# Patient Record
Sex: Male | Born: 1980 | Race: White | Hispanic: No | Marital: Single | Smoking: Former smoker
Health system: Southern US, Community
[De-identification: ages and names within clinical notes are randomized; demographics above are authoritative.]

---

## 2005-02-13 ENCOUNTER — Emergency Department: Payer: Self-pay | Admitting: Emergency Medicine

## 2005-02-14 ENCOUNTER — Other Ambulatory Visit: Payer: Self-pay

## 2005-06-09 ENCOUNTER — Emergency Department: Payer: Self-pay | Admitting: Emergency Medicine

## 2007-09-25 ENCOUNTER — Emergency Department: Payer: Self-pay | Admitting: Unknown Physician Specialty

## 2008-01-23 ENCOUNTER — Emergency Department: Payer: Self-pay | Admitting: Emergency Medicine

## 2008-06-05 ENCOUNTER — Emergency Department: Payer: Self-pay | Admitting: Emergency Medicine

## 2008-06-05 ENCOUNTER — Other Ambulatory Visit: Payer: Self-pay

## 2008-08-14 ENCOUNTER — Emergency Department: Payer: Self-pay | Admitting: Emergency Medicine

## 2009-02-13 ENCOUNTER — Emergency Department: Payer: Self-pay | Admitting: Emergency Medicine

## 2010-10-15 ENCOUNTER — Emergency Department: Payer: Self-pay | Admitting: Emergency Medicine

## 2011-09-30 ENCOUNTER — Emergency Department: Payer: Self-pay | Admitting: Emergency Medicine

## 2011-09-30 LAB — URINALYSIS, COMPLETE
Bacteria: NONE SEEN
Bilirubin,UR: NEGATIVE
Blood: NEGATIVE
Glucose,UR: NEGATIVE mg/dL (ref 0–75)
Leukocyte Esterase: NEGATIVE
Nitrite: NEGATIVE
Protein: NEGATIVE
Specific Gravity: 1.015 (ref 1.003–1.030)
Squamous Epithelial: <1

## 2011-11-08 ENCOUNTER — Emergency Department: Payer: Self-pay | Admitting: Emergency Medicine

## 2012-04-29 ENCOUNTER — Emergency Department: Payer: Self-pay | Admitting: Emergency Medicine

## 2012-04-29 LAB — URINALYSIS, COMPLETE
Bilirubin,UR: NEGATIVE
Glucose,UR: NEGATIVE mg/dL (ref 0–75)
Leukocyte Esterase: NEGATIVE
Ph: 6 (ref 4.5–8.0)
Protein: NEGATIVE
RBC,UR: <1 /HPF (ref 0–5)
Squamous Epithelial: NONE SEEN

## 2015-07-13 ENCOUNTER — Encounter: Payer: Self-pay | Admitting: Emergency Medicine

## 2015-07-13 ENCOUNTER — Emergency Department: Payer: Worker's Compensation

## 2015-07-13 ENCOUNTER — Emergency Department
Admission: EM | Admit: 2015-07-13 | Discharge: 2015-07-13 | Disposition: A | Discharge status: Home / Self Care | Payer: Worker's Compensation | Attending: Emergency Medicine | Admitting: Emergency Medicine

## 2015-07-13 DIAGNOSIS — Y9289 Other specified places as the place of occurrence of the external cause: Secondary | ICD-10-CM | POA: Diagnosis not present

## 2015-07-13 DIAGNOSIS — W311XXA Contact with metalworking machines, initial encounter: Secondary | ICD-10-CM | POA: Insufficient documentation

## 2015-07-13 DIAGNOSIS — Z4800 Encounter for change or removal of nonsurgical wound dressing: Secondary | ICD-10-CM | POA: Insufficient documentation

## 2015-07-13 DIAGNOSIS — Y990 Civilian activity done for income or pay: Secondary | ICD-10-CM | POA: Diagnosis not present

## 2015-07-13 DIAGNOSIS — S6992XA Unspecified injury of left wrist, hand and finger(s), initial encounter: Secondary | ICD-10-CM | POA: Diagnosis present

## 2015-07-13 DIAGNOSIS — S61432A Puncture wound without foreign body of left hand, initial encounter: Secondary | ICD-10-CM

## 2015-07-13 DIAGNOSIS — Y9389 Activity, other specified: Secondary | ICD-10-CM | POA: Diagnosis not present

## 2015-07-13 DIAGNOSIS — Z23 Encounter for immunization: Secondary | ICD-10-CM | POA: Diagnosis not present

## 2015-07-13 DIAGNOSIS — S61442A Puncture wound with foreign body of left hand, initial encounter: Secondary | ICD-10-CM | POA: Diagnosis not present

## 2015-07-13 MED ORDER — AMOXICILLIN-POT CLAVULANATE 875-125 MG PO TABS: 1.0000 | ORAL_TABLET | Freq: Two times a day (BID) | ORAL | Status: AC

## 2015-07-13 MED ORDER — LIDOCAINE HCL (PF) 1 % IJ SOLN
INTRAMUSCULAR | Status: AC
  Administered 2015-07-13: 5 mL
  Filled 2015-07-13: qty 5

## 2015-07-13 MED ORDER — TETANUS-DIPHTH-ACELL PERTUSSIS 5-2.5-18.5 LF-MCG/0.5 IM SUSP
0.5000 mL | Freq: Once | INTRAMUSCULAR | Status: AC
  Administered 2015-07-13: 0.5 mL via INTRAMUSCULAR
  Filled 2015-07-13: qty 0.5

## 2015-07-13 MED ORDER — OXYCODONE-ACETAMINOPHEN 5-325 MG PO TABS: 1.0000 | ORAL_TABLET | Freq: Four times a day (QID) | ORAL | Status: AC | PRN

## 2015-07-13 MED ORDER — OXYCODONE-ACETAMINOPHEN 5-325 MG PO TABS
1.0000 | ORAL_TABLET | Freq: Once | ORAL | Status: AC
  Administered 2015-07-13: 1 via ORAL
  Filled 2015-07-13: qty 1

## 2015-07-13 NOTE — ED Notes (Signed)
Pt with laceration today with a drill bit. Pt was at work and drill slipped. Cms intact.  workmans comp.

## 2015-07-13 NOTE — ED Notes (Signed)
Pt. Seen here today for puncture wound to lt. Hand.  Pt. States bleeding started up again this evening.   Pt. States EMS was called to house, EMS bandaged up lt hand, bleeding controlled at this time.

## 2015-07-13 NOTE — ED Provider Notes (Signed)
[facility] Emergency Department Provider Note  ____________________________________________  Time seen: Approximately 2:49 PM  I have reviewed the triage vital signs and the nursing notes.   HISTORY  Chief Complaint Extremity Laceration    HPI Dakota Lee is a 34 y.o. male who presents to the emergency department complaining of injury to his proximal left thumb. He states that he was at work trying to using drillbit when the drill bit broke causing him to puncture the proximal aspect of his left thumb with the drill bit. He states that this occurred so quickly he was unable to release the trigger on the drill prior to it concerning his hand. He states now he is having considerable amount of pain in the thenar eminence as well as proximal thumb. He states that he does have some numbness to the distal aspect of his thumb. He verbalizes being able to see "a little bit of metal stuck in there." Bleeding was able to be controlled prior to arrival. He denies any other injury. He states that the pain is a sharp sensation. Severe. He is not taking anything prior to arrival.   History reviewed. No pertinent past medical history.  There are no active problems to display for this patient.   History reviewed. No pertinent past surgical history.  Current Outpatient Rx  Name  Route  Sig  Dispense  Refill  . amoxicillin-clavulanate (AUGMENTIN) 875-125 MG tablet   Oral   Take 1 tablet by mouth 2 (two) times daily.   14 tablet   0   . oxyCODONE-acetaminophen (ROXICET) 5-325 MG tablet   Oral   Take 1 tablet by mouth every 6 (six) hours as needed for severe pain.   20 tablet   0     Allergies Review of patient's allergies indicates no known allergies.  No family history on file.  Social History Social History  Substance Use Topics  . Smoking status: Never Smoker   . Smokeless tobacco: None  . Alcohol Use: Yes    Review of Systems Constitutional: No  fever/chills Eyes: No visual changes. ENT: No sore throat. Cardiovascular: Denies chest pain. Respiratory: Denies shortness of breath. Gastrointestinal: No abdominal pain.  No nausea, no vomiting.  No diarrhea.  No constipation. Genitourinary: Negative for dysuria. Musculoskeletal: Negative for back pain. Skin: Negative for rash. Endorses a puncture wound to the proximal left thumb. Neurological: Negative for headaches, focal weakness or numbness.  10-point ROS otherwise negative.  ____________________________________________   PHYSICAL EXAM:  VITAL SIGNS: ED Triage Vitals  Enc Vitals Group     BP 07/13/15 1435 121/83 mmHg     Pulse Rate 07/13/15 1435 82     Resp 07/13/15 1435 20     Temp 07/13/15 1435 98.4 F (36.9 C)     Temp Source 07/13/15 1435 Oral     SpO2 07/13/15 1435 96 %     Weight 07/13/15 1435 140 lb (63.504 kg)     Height 07/13/15 1435  (1.727 m)     Head Cir --      Peak Flow --      Pain Score 07/13/15 1435 10     Pain Loc --      Pain Edu? --      Excl. in GC? --     Constitutional: Alert and oriented. Well appearing and in no acute distress. Eyes: Conjunctivae are normal. PERRL. EOMI. Head: Atraumatic. Nose: No congestion/rhinnorhea. Mouth/Throat: Mucous membranes are moist.  Oropharynx non-erythematous. Neck:  No stridor.   Cardiovascular: Normal rate, regular rhythm. Grossly normal heart sounds.  Good peripheral circulation. Respiratory: Normal respiratory effort.  No retractions. Lungs CTAB. Gastrointestinal: Soft and nontender. No distention. No abdominal bruits. No CVA tenderness. Musculoskeletal: No lower extremity tenderness nor edema.  No joint effusions. Neurologic:  Normal speech and language. No gross focal neurologic deficits are appreciated. No gait instability. Skin:  Skin is warm, dry and intact. No rash noted. Puncture wound noted proximal left thumb. Bleeding controlled. Visible foreign body visualized with dark  metal. Psychiatric: Mood and affect are normal. Speech and behavior are normal.  ____________________________________________   LABS (all labs ordered are listed, but only abnormal results are displayed)  Labs Reviewed - No data to display ____________________________________________  EKG   ____________________________________________  RADIOLOGY  Left hand x-ray Impression: No definitive fractures seen. A metallic foreign body is noted. ____________________________________________   PROCEDURES  Procedure(s) performed: Yes, wound debridement. The patient's wound was cleansed using Betadine. The area was anesthetized using local infiltration with 1% lidocaine with no epi. The wound was then thoroughly irrigated using 100 mL of normal saline. Then instrumentation was used to explore wound with metallic foreign body located and extracted. No visible tendon involvement. Wound was left open. She tolerated procedure well. PMS was intact prior to and after procedure. consent from patient was obtained prior to procedure.  Critical Care performed: No  ____________________________________________   INITIAL IMPRESSION / ASSESSMENT AND PLAN / ED COURSE  Pertinent labs & imaging results that were available during my care of the patient were reviewed by me and considered in my medical decision making (see chart for details).  Patient's history, symptoms, physical exam, and radiological findings are consistent with a puncture wound from a drillbit. There was a retained metallic foreign body that was appreciated both on exam as well as her radiological imaging. The patient was anesthetized using local infiltration of lidocaine and wound was thoroughly explored and irrigated. Foreign body was appreciated and extracted. Patient tolerated procedure well. The patient was given a tetanus shot here in the emergency department since his last tetanus was unknown. Placed on Augmentin and pain medication.  The patient is to follow-up with emergency department should for any worsening of symptoms. The patient verbalizes understanding of diagnosis and treatment plan and verbalizes compliance with same. ____________________________________________   FINAL CLINICAL IMPRESSION(S) / ED DIAGNOSES  Final diagnoses:  Puncture wound, hand, left, initial encounter      Racheal PatchesJonathan D Cuthriell, PA-C 07/13/15 1704  Governor Rooksebecca Lord, MD 07/15/15 1108

## 2015-07-13 NOTE — Discharge Instructions (Signed)
Puncture Wound A puncture wound is an injury that is caused by a sharp, thin object that goes through (penetrates) your skin. Usually, a puncture wound does not leave a large opening in your skin, so it may not bleed a lot. However, when you get a puncture wound, dirt or other materials (foreign bodies) can be forced into your wound and break off inside. This increases the chance of infection, such as tetanus. CAUSES Puncture wounds are caused by any sharp, thin object that goes through your skin, such as:  Animal teeth, as with an animal bite.  Sharp, pointed objects, such as nails, splinters of glass, fishhooks, and needles. SYMPTOMS Symptoms of a puncture wound include:  Pain.  Bleeding.  Swelling.  Bruising.  Fluid leaking from the wound.  Numbness, tingling, or loss of function. DIAGNOSIS This condition is diagnosed with a medical history and physical exam. Your wound will be checked to see if it contains any foreign bodies. You may also have X-rays or other imaging tests. TREATMENT Treatment for a puncture wound depends on how serious the wound is. It also depends on whether the wound contains any foreign bodies. Treatment for all types of puncture wounds usually starts with:  Controlling the bleeding.  Washing out the wound with a germ-free (sterile) salt-water solution.  Checking the wound for foreign bodies. Treatment may also include:  Having the wound opened surgically to remove a foreign object.  Closing the wound with stitches (sutures) if it continues to bleed.  Covering the wound with antibiotic ointments and a bandage (dressing).  Receiving a tetanus shot.  Receiving a rabies vaccine. HOME CARE INSTRUCTIONS Medicines  Take or apply over-the-counter and prescription medicines only as told by your health care provider.  If you were prescribed an antibiotic, take or apply it as told by your health care provider. Do not stop using the antibiotic even if  your condition improves. Wound Care  There are many ways to close and cover a wound. For example, a wound can be covered with sutures, skin glue, or adhesive strips. Follow instructions from your health care provider about:  How to take care of your wound.  When and how you should change your dressing.  When you should remove your dressing.  Removing whatever was used to close your wound.  Keep the dressing dry as told by your health care provider. Do not take baths, swim, use a hot tub, or do anything that would put your wound underwater until your health care provider approves.  Clean the wound as told by your health care provider.  Do not scratch or pick at the wound.  Check your wound every day for signs of infection. Watch for:  Redness, swelling, or pain.  Fluid, blood, or pus. General Instructions  Raise (elevate) the injured area above the level of your heart while you are sitting or lying down.  If your puncture wound is in your foot, ask your health care provider if you need to avoid putting weight on your foot and for how long.  Keep all follow-up visits as told by your health care provider. This is important. SEEK MEDICAL CARE IF:  You received a tetanus shot and you have swelling, severe pain, redness, or bleeding at the injection site.  You have a fever.  Your sutures come out.  You notice a bad smell coming from your wound or your dressing.  You notice something coming out of your wound, such as wood or glass.  Your   pain is not controlled with medicine.  You have increased redness, swelling, or pain at the site of your wound.  You have fluid, blood, or pus coming from your wound.  You notice a change in the color of your skin near your wound.  You need to change the dressing frequently due to fluid, blood, or pus draining from your wound.  You develop a new rash.  You develop numbness around your wound. SEEK IMMEDIATE MEDICAL CARE IF:  You  develop severe swelling around your wound.  Your pain suddenly increases and is severe.  You develop painful skin lumps.  You have a red streak going away from your wound.  The wound is on your hand or foot and you cannot properly move a finger or toe.  The wound is on your hand or foot and you notice that your fingers or toes look pale or bluish.   This information is not intended to replace advice given to you by your health care provider. Make sure you discuss any questions you have with your health care provider.   Document Released: 06/18/2005 Document Revised: 05/30/2015 Document Reviewed: 11/01/2014 Elsevier Interactive Patient Education 2016 [employer]  

## 2015-07-14 ENCOUNTER — Emergency Department
Admission: EM | Admit: 2015-07-14 | Discharge: 2015-07-14 | Discharge status: Against Medical Advice | Payer: Worker's Compensation | Attending: Emergency Medicine | Admitting: Emergency Medicine

## 2015-08-26 NOTE — ED Provider Notes (Signed)
See in chart attestation.  Governor Rooksebecca Layn Kye, MD 08/26/15 [phone]

## 2015-08-26 NOTE — ED Provider Notes (Signed)
See attested chart.  Governor Rooksebecca Eluzer [place]deshell, MD 08/26/15 [phone]

## 2015-08-31 NOTE — ED Provider Notes (Signed)
Please see in-note attestation.  Governor Rooksebecca Rodd Heft, MD 08/31/15 2213

## 2015-11-23 ENCOUNTER — Emergency Department
Admission: EM | Admit: 2015-11-23 | Discharge: 2015-11-24 | Disposition: A | Discharge status: Home / Self Care | Payer: BLUE CROSS/BLUE SHIELD | Attending: Emergency Medicine | Admitting: Emergency Medicine

## 2015-11-23 DIAGNOSIS — F17210 Nicotine dependence, cigarettes, uncomplicated: Secondary | ICD-10-CM | POA: Diagnosis not present

## 2015-11-23 DIAGNOSIS — R000 Tachycardia, unspecified: Secondary | ICD-10-CM | POA: Insufficient documentation

## 2015-11-23 DIAGNOSIS — A0811 Acute gastroenteropathy due to Norwalk agent: Secondary | ICD-10-CM | POA: Diagnosis not present

## 2015-11-23 DIAGNOSIS — R112 Nausea with vomiting, unspecified: Secondary | ICD-10-CM | POA: Diagnosis present

## 2015-11-23 DIAGNOSIS — Z792 Long term (current) use of antibiotics: Secondary | ICD-10-CM | POA: Insufficient documentation

## 2015-11-23 LAB — CBC WITH DIFFERENTIAL/PLATELET
Basophils Absolute: 0 10*3/uL (ref 0–0.1)
Basophils Relative: 0 %
EOS PCT: 0 %
Eosinophils Absolute: 0 10*3/uL (ref 0–0.7)
HEMATOCRIT: 50.8 % (ref 40.0–52.0)
Hemoglobin: 17.1 g/dL (ref 13.0–18.0)
LYMPHS PCT: 5 %
Lymphs Abs: 0.7 10*3/uL — ABNORMAL LOW (ref 1.0–3.6)
MCH: 31.7 pg (ref 26.0–34.0)
MCHC: 33.7 g/dL (ref 32.0–36.0)
MCV: 94 fL (ref 80.0–100.0)
MONO ABS: 0.5 10*3/uL (ref 0.2–1.0)
MONOS PCT: 4 %
NEUTROS ABS: 13.1 10*3/uL — AB (ref 1.4–6.5)
Neutrophils Relative %: 91 %
PLATELETS: 211 10*3/uL (ref 150–440)
RBC: 5.41 MIL/uL (ref 4.40–5.90)
RDW: 12.7 % (ref 11.5–14.5)
WBC: 14.4 10*3/uL — ABNORMAL HIGH (ref 3.8–10.6)

## 2015-11-23 LAB — COMPREHENSIVE METABOLIC PANEL
ALT: 21 U/L (ref 17–63)
ANION GAP: 11 (ref 5–15)
AST: 28 U/L (ref 15–41)
Albumin: 4.9 g/dL (ref 3.5–5.0)
Alkaline Phosphatase: 79 U/L (ref 38–126)
BILIRUBIN TOTAL: 1.1 mg/dL (ref 0.3–1.2)
BUN: 18 mg/dL (ref 6–20)
CO2: 25 mmol/L (ref 22–32)
Calcium: 9.5 mg/dL (ref 8.9–10.3)
Chloride: 105 mmol/L (ref 101–111)
Creatinine, Ser: 1.04 mg/dL (ref 0.61–1.24)
Glucose, Bld: 117 mg/dL — ABNORMAL HIGH (ref 65–99)
POTASSIUM: 4 mmol/L (ref 3.5–5.1)
Sodium: 141 mmol/L (ref 135–145)
TOTAL PROTEIN: 8.4 g/dL — AB (ref 6.5–8.1)

## 2015-11-23 LAB — LIPASE, BLOOD: LIPASE: 17 U/L (ref 11–51)

## 2015-11-23 MED ORDER — SODIUM CHLORIDE 0.9 % IV BOLUS (SEPSIS)
1000.0000 mL | INTRAVENOUS | Status: AC
  Administered 2015-11-23: 1000 mL via INTRAVENOUS

## 2015-11-23 MED ORDER — ONDANSETRON HCL 4 MG/2ML IJ SOLN
4.0000 mg | Freq: Once | INTRAMUSCULAR | Status: AC
  Administered 2015-11-23: 4 mg via INTRAVENOUS
  Filled 2015-11-23: qty 2

## 2015-11-23 NOTE — ED Provider Notes (Signed)
[facility] Emergency Department Provider Note  ____________________________________________  Time seen: Approximately 11:01 PM  I have reviewed the triage vital signs and the nursing notes.   HISTORY  Chief Complaint Emesis; Diarrhea; and Abdominal Pain    HPI Dakota Lee is a 35 y.o. male with no significant past medical history who presents with acute onset of nausea, vomiting, diarrhea.  This started approximately 4 hours prior to arrival.  He states that it started about 4-5 hours after eating a meal.  It started with nausea and then rapidly developed into several episodes of emesis and then copious loose stools, he estimates 10-12 episodes.  He had diarrhea after being placed in the exam room and the nurse reports that it was exceptionally foul smelling liquid with no blood present.  The patient denies any recent sick contacts.  He reports that his body hurts all over and he feels anxious.  He denies fever/chills, chest pain, shortness of breath, headache, neck pain, neck stiffness.  He describes symptoms as severe and nothing is helping.   History reviewed. No pertinent past medical history.  There are no active problems to display for this patient.   History reviewed. No pertinent past surgical history.  Current Outpatient Rx  Name  Route  Sig  Dispense  Refill  . amoxicillin-clavulanate (AUGMENTIN) 875-125 MG tablet   Oral   Take 1 tablet by mouth 2 (two) times daily.   14 tablet   0   . ondansetron (ZOFRAN) 4 MG tablet      Take 1-2 tabs by mouth every 8 hours as needed for nausea/vomiting   30 tablet   0   . oxyCODONE-acetaminophen (ROXICET) 5-325 MG tablet   Oral   Take 1 tablet by mouth every 6 (six) hours as needed for severe pain.   20 tablet   0     Allergies Review of patient's allergies indicates no known allergies.  History reviewed. No pertinent family history.  Social History Social History  Substance Use Topics   . Smoking status: Current Every Day Smoker    Types: Cigarettes  . Smokeless tobacco: None  . Alcohol Use: 7.2 oz/week    12 Cans of beer per week    Review of Systems Constitutional: No fever/chills.  General body aches Eyes: No visual changes. ENT: No sore throat. Cardiovascular: Denies chest pain. Respiratory: Denies shortness of breath. Gastrointestinal: No abdominal pain.  Multiple episodes of emesis and copious diarrhea Genitourinary: Negative for dysuria. Musculoskeletal: Negative for back pain. Skin: Negative for rash. Neurological: Negative for headaches, focal weakness or numbness.  10-point ROS otherwise negative.  ____________________________________________   PHYSICAL EXAM:  VITAL SIGNS: ED Triage Vitals  Enc Vitals Group     BP 11/23/15 2247 120/79 mmHg     Pulse Rate 11/23/15 2247 94     Resp 11/23/15 2247 23     Temp 11/23/15 2247 97.5 F (36.4 C)     Temp Source 11/23/15 2247 Oral     SpO2 11/23/15 2233 99 %     Weight 11/23/15 2247 145 lb (65.772 kg)     Height 11/23/15 2247  (1.727 m)     Head Cir --      Peak Flow --      Pain Score 11/23/15 2248 7     Pain Loc --      Pain Edu? --      Excl. in GC? --     Constitutional: Alert and oriented.  No acute distress but appears uncomfortable. Eyes: Conjunctivae are normal. PERRL. EOMI. Head: Atraumatic. Nose: No congestion/rhinnorhea. Mouth/Throat: Mucous membranes are moist.  Oropharynx non-erythematous. Neck: No stridor.  No meningismus Cardiovascular: Mild Tachycardia, regular rhythm. Grossly normal heart sounds.  Good peripheral circulation. Respiratory: Normal respiratory effort.  No retractions. Lungs CTAB. Gastrointestinal: Thin and muscular body habitus.  Soft and nontender. No distention. No abdominal bruits. No CVA tenderness. Musculoskeletal: No lower extremity tenderness nor edema.  No joint effusions. Neurologic:  Normal speech and language. No gross focal neurologic deficits  are appreciated.  Skin:  Skin is warm, dry and intact. No rash noted. Psychiatric: Mood and affect are anxious. Speech and behavior are normal.  ____________________________________________   LABS (all labs ordered are listed, but only abnormal results are displayed)  Labs Reviewed  GASTROINTESTINAL PANEL BY PCR, STOOL (REPLACES STOOL CULTURE) - Abnormal; Notable for the following:    Norovirus GI/GII DETECTED (*)    All other components within normal limits  CBC WITH DIFFERENTIAL/PLATELET - Abnormal; Notable for the following:    WBC 14.4 (*)    Neutro Abs 13.1 (*)    Lymphs Abs 0.7 (*)    All other components within normal limits  COMPREHENSIVE METABOLIC PANEL - Abnormal; Notable for the following:    Glucose, Bld 117 (*)    Total Protein 8.4 (*)    All other components within normal limits  LIPASE, BLOOD   ____________________________________________  EKG  None ____________________________________________  RADIOLOGY   No results found.  ____________________________________________   PROCEDURES  Procedure(s) performed: None  Critical Care performed: No ____________________________________________   INITIAL IMPRESSION / ASSESSMENT AND PLAN / ED COURSE  Pertinent labs & imaging results that were available during my care of the patient were reviewed by me and considered in my medical decision making (see chart for details).  Suspected viral gastroenteritis versus food poisoning.  Given the foul-smelling and copious nature of the diarrhea, a GI pathogen panel is pending.  No indication for checking C. difficile in this young otherwise healthy patient with no risk factors.  Providing IV fluids and antiemetics.  He has a cardiac 2 rounds of Zofran and so this time I am giving Haldol 1 mg IV and Benadryl 25 mg IV which is very effective at nausea control.  No indication for pain medicine at this time or imaging studies.  We will  reassess.  ----------------------------------------- 2:26 AM on 11/24/2015 -----------------------------------------  +Norovirus.  Sleeping tacy at 105, tachy in 120s-130s when awake.  Nausea improved.  Giving 3rd L of fluids.  Anticipate outpatient management.  ----------------------------------------- 3:54 AM on 11/24/2015 -----------------------------------------  Patient feels better, we will discharge for outpatient follow-up.    I gave my usual and customary return precautions.    ____________________________________________  FINAL CLINICAL IMPRESSION(S) / ED DIAGNOSES  Final diagnoses:  Gastroenteritis due to norovirus      NEW MEDICATIONS STARTED DURING THIS VISIT:  New Prescriptions   ONDANSETRON (ZOFRAN) 4 MG TABLET    Take 1-2 tabs by mouth every 8 hours as needed for nausea/vomiting      Note:  This document was prepared using Dragon voice recognition software and may include unintentional dictation errors.   Loleta Roseory Dwight Adamczak, MD 11/24/15 [phone]

## 2015-11-23 NOTE — ED Notes (Signed)
PT requesting PO fluids. MD York CeriseForbach notified. MD York CeriseForbach approved. Pt brought water.

## 2015-11-23 NOTE — ED Notes (Signed)
Per EMS: PT c/o of N/V/D since 7 pm. Pt given 250 ml NaCl, and 4 mg IV zofran.   Pt currently c/o of N/V/D, weakness, and SOB. Pt also reports pain in upper right quadrant.  Pt reports he is a [employer] and unsure if exposed to anyone sick. Pt

## 2015-11-24 LAB — GASTROINTESTINAL PANEL BY PCR, STOOL (REPLACES STOOL CULTURE)
Adenovirus F40/41: NOT DETECTED
Astrovirus: NOT DETECTED
Campylobacter species: NOT DETECTED
Cryptosporidium: NOT DETECTED
Cyclospora cayetanensis: NOT DETECTED
E. coli O157: NOT DETECTED
ENTAMOEBA HISTOLYTICA: NOT DETECTED
ENTEROAGGREGATIVE E COLI (EAEC): NOT DETECTED
Enteropathogenic E coli (EPEC): NOT DETECTED
Enterotoxigenic E coli (ETEC): NOT DETECTED
Giardia lamblia: NOT DETECTED
NOROVIRUS GI/GII: DETECTED — AB
Plesimonas shigelloides: NOT DETECTED
Rotavirus A: NOT DETECTED
SALMONELLA SPECIES: NOT DETECTED
SHIGELLA/ENTEROINVASIVE E COLI (EIEC): NOT DETECTED
Sapovirus (I, II, IV, and V): NOT DETECTED
Shiga like toxin producing E coli (STEC): NOT DETECTED
VIBRIO CHOLERAE: NOT DETECTED
Vibrio species: NOT DETECTED
YERSINIA ENTEROCOLITICA: NOT DETECTED

## 2015-11-24 MED ORDER — SODIUM CHLORIDE 0.9 % IV BOLUS (SEPSIS)
1000.0000 mL | INTRAVENOUS | Status: AC
  Administered 2015-11-24: 1000 mL via INTRAVENOUS

## 2015-11-24 MED ORDER — ONDANSETRON HCL 4 MG PO TABS: ORAL_TABLET | ORAL | Status: AC

## 2015-11-24 MED ORDER — ACETAMINOPHEN 325 MG PO TABS: 650.0000 mg | ORAL_TABLET | Freq: Once | ORAL | Status: DC

## 2015-11-24 MED ORDER — DIPHENHYDRAMINE HCL 50 MG/ML IJ SOLN
25.0000 mg | Freq: Once | INTRAMUSCULAR | Status: AC
  Administered 2015-11-24: 25 mg via INTRAVENOUS
  Filled 2015-11-24: qty 1

## 2015-11-24 MED ORDER — ACETAMINOPHEN 500 MG PO TABS
1000.0000 mg | ORAL_TABLET | Freq: Once | ORAL | Status: AC
  Administered 2015-11-24: 1000 mg via ORAL
  Filled 2015-11-24: qty 2

## 2015-11-24 MED ORDER — HALOPERIDOL LACTATE 5 MG/ML IJ SOLN
1.0000 mg | Freq: Once | INTRAMUSCULAR | Status: AC
  Administered 2015-11-24: 1 mg via INTRAVENOUS
  Filled 2015-11-24: qty 1

## 2015-11-24 MED ORDER — LOPERAMIDE HCL 2 MG PO CAPS
4.0000 mg | ORAL_CAPSULE | Freq: Once | ORAL | Status: AC
  Administered 2015-11-24: 4 mg via ORAL
  Filled 2015-11-24: qty 2

## 2015-11-24 NOTE — Discharge Instructions (Signed)
We believe your symptoms are caused by a viral infection called Norovirus.  Please read through the included information and stick to a bland diet for the next two days.  Drink plenty of clear fluids, and if you were provided with a prescription, please take it according to the label instructions.  Consider drinking Gatorade or oral rehydration solution (available over-the-counter from the pharmacy).  Use the prescribed Zofran as needed for nausea and use over-the-counter antidiarrheal medication such as Imodium (loperamide) according to label instructions.  If you develop any new or worsening symptoms, including persistent vomiting not controlled with medication, fever greater than 101, severe or worsening abdominal pain, or other symptoms that concern you, please return immediately to the Emergency Department.   Norovirus Infection A norovirus infection is caused by exposure to a virus in a group of similar viruses (noroviruses). This type of infection causes inflammation in your stomach and intestines (gastroenteritis). Norovirus is the most common cause of gastroenteritis. It also causes food poisoning. Anyone can get a norovirus infection. It spreads very easily (contagious). You can get it from contaminated food, water, surfaces, or other people. Norovirus is found in the stool or vomit of infected people. You can spread the infection as soon as you feel sick until 2 weeks after you recover.  Symptoms usually begin within 2 days after you become infected. Most norovirus symptoms affect the digestive system. CAUSES Norovirus infection is caused by contact with norovirus. You can catch norovirus if you:  Eat or drink something contaminated with norovirus.  Touch surfaces or objects contaminated with norovirus and then put your hand in your mouth.  Have direct contact with an infected person who has symptoms.  Share food, drink, or utensils with someone with who is sick with norovirus. SIGNS  AND SYMPTOMS Symptoms of norovirus may include:  Nausea.  Vomiting.  Diarrhea.  Stomach cramps.  Fever.  Chills.  Headache.  Muscle aches.  Tiredness. DIAGNOSIS Your health care provider may suspect norovirus based on your symptoms and physical exam. Your health care provider may also test a sample of your stool or vomit for the virus.  TREATMENT There is no specific treatment for norovirus. Most people get better without treatment in about 2 days. HOME CARE INSTRUCTIONS  Replace lost fluids by drinking plenty of water or rehydration fluids containing important minerals called electrolytes. This prevents dehydration. Drink enough fluid to keep your urine clear or pale yellow.  Do not prepare food for others while you are infected. Wait at least 3 days after recovering from the illness to do that. PREVENTION   Wash your hands often, especially after using the toilet or changing a diaper.  Wash fruits and vegetables thoroughly before preparing or serving them.  Throw out any food that a sick person may have touched.  Disinfect contaminated surfaces immediately after someone in the household has been sick. Use a bleach-based household cleaner.  Immediately remove and wash soiled clothes or sheets. SEEK MEDICAL CARE IF:  Your vomiting, diarrhea, and stomach pain is getting worse.  Your symptoms of norovirus do not go away after 2-3 days. SEEK IMMEDIATE MEDICAL CARE IF:  You develop symptoms of dehydration that do not improve with fluid replacement. This may include:  Excessive sleepiness.  Lack of tears.  Dry mouth.  Dizziness when standing.  Weak pulse.   This information is not intended to replace advice given to you by your health care provider. Make sure you discuss any questions you have with your  health care provider.   Document Released: 11/29/2002 Document Revised: 09/29/2014 Document Reviewed: 02/16/2014 Elsevier Interactive Patient Education 2016  [employer]  Food Choices to Help Relieve Diarrhea, Adult When you have diarrhea, the foods you eat and your eating habits are very important. Choosing the right foods and drinks can help relieve diarrhea. Also, because diarrhea can last up to 7 days, you need to replace lost fluids and electrolytes (such as sodium, potassium, and chloride) in order to help prevent dehydration.  WHAT GENERAL GUIDELINES DO I NEED TO FOLLOW?  Slowly drink 1 cup (8 oz) of fluid for each episode of diarrhea. If you are getting enough fluid, your urine will be clear or pale yellow.  Eat starchy foods. Some good choices include white rice, white toast, pasta, low-fiber cereal, baked potatoes (without the skin), saltine crackers, and bagels.  Avoid large servings of any cooked vegetables.  Limit fruit to two servings per day. A serving is  cup or 1 small piece.  Choose foods with less than 2 g of fiber per serving.  Limit fats to less than 8 tsp (38 g) per day.  Avoid fried foods.  Eat foods that have probiotics in them. Probiotics can be found in certain dairy products.  Avoid foods and beverages that may increase the speed at which food moves through the stomach and intestines (gastrointestinal tract). Things to avoid include:  High-fiber foods, such as dried fruit, raw fruits and vegetables, nuts, seeds, and whole grain foods.  Spicy foods and high-fat foods.  Foods and beverages sweetened with high-fructose corn syrup, honey, or sugar alcohols such as xylitol, sorbitol, and mannitol. WHAT FOODS ARE RECOMMENDED? Grains White rice. White, [place], or pita breads (fresh or toasted), including plain rolls, buns, or bagels. White pasta. Saltine, soda, or graham crackers. Pretzels. Low-fiber cereal. Cooked cereals made with water (such as cornmeal, farina, or cream cereals). Plain muffins. Matzo. Melba toast. Zwieback.  Vegetables Potatoes (without the skin). Strained tomato and vegetable juices. Most  well-cooked and canned vegetables without seeds. Tender lettuce. Fruits Cooked or canned applesauce, apricots, cherries, fruit cocktail, grapefruit, peaches, pears, or plums. Fresh bananas, apples without skin, cherries, grapes, cantaloupe, grapefruit, peaches, oranges, or plums.  Meat and Other Protein Products Baked or boiled chicken. Eggs. Tofu. Fish. Seafood. Smooth peanut butter. Ground or well-cooked tender beef, ham, veal, lamb, pork, or poultry.  Dairy Plain yogurt, kefir, and unsweetened liquid yogurt. Lactose-free milk, buttermilk, or soy milk. Plain hard cheese. Beverages Sport drinks. Clear broths. Diluted fruit juices (except prune). Regular, caffeine-free sodas such as ginger ale. Water. Decaffeinated teas. Oral rehydration solutions. Sugar-free beverages not sweetened with sugar alcohols. Other Bouillon, broth, or soups made from recommended foods.  The items listed above may not be a complete list of recommended foods or beverages. Contact your dietitian for more options. WHAT FOODS ARE NOT RECOMMENDED? Grains Whole grain, whole wheat, bran, or rye breads, rolls, pastas, crackers, and cereals. Wild or brown rice. Cereals that contain more than 2 g of fiber per serving. Corn tortillas or taco shells. Cooked or dry oatmeal. Granola. Popcorn. Vegetables Raw vegetables. Cabbage, broccoli, Brussels sprouts, artichokes, baked beans, beet greens, corn, kale, legumes, peas, sweet potatoes, and yams. Potato skins. Cooked spinach and cabbage. Fruits Dried fruit, including raisins and dates. Raw fruits. Stewed or dried prunes. Fresh apples with skin, apricots, mangoes, pears, raspberries, and strawberries.  Meat and Other Protein Products Chunky peanut butter. Nuts and seeds. Beans and lentils. Tomasa BlaseBacon.  Dairy High-fat cheeses. Milk,  chocolate milk, and beverages made with milk, such as milk shakes. Cream. Ice cream. Sweets and Desserts Sweet rolls, doughnuts, and sweet breads. Pancakes  and waffles. Fats and Oils Butter. Cream sauces. Margarine. Salad oils. Plain salad dressings. Olives. Avocados.  Beverages Caffeinated beverages (such as coffee, tea, soda, or energy drinks). Alcoholic beverages. Fruit juices with pulp. Prune juice. Soft drinks sweetened with high-fructose corn syrup or sugar alcohols. Other Coconut. Hot sauce. Chili powder. Mayonnaise. Gravy. Cream-based or milk-based soups.  The items listed above may not be a complete list of foods and beverages to avoid. Contact your dietitian for more information. WHAT SHOULD I DO IF I BECOME DEHYDRATED? Diarrhea can sometimes lead to dehydration. Signs of dehydration include dark urine and dry mouth and skin. If you think you are dehydrated, you should rehydrate with an oral rehydration solution. These solutions can be purchased at pharmacies, retail stores, or online.  Drink -1 cup (120-240 mL) of oral rehydration solution each time you have an episode of diarrhea. If drinking this amount makes your diarrhea worse, try drinking smaller amounts more often. For example, drink 1-3 tsp (5-15 mL) every 5-10 minutes.  A general rule for staying hydrated is to drink 1-2 L of fluid per day. Talk to your health care provider about the specific amount you should be drinking each day. Drink enough fluids to keep your urine clear or pale yellow.   This information is not intended to replace advice given to you by your health care provider. Make sure you discuss any questions you have with your health care provider.   Document Released: 11/29/2003 Document Revised: 09/29/2014 Document Reviewed: 08/01/2013 Elsevier Interactive Patient Education [employer]

## 2015-11-24 NOTE — ED Notes (Signed)
Pt reported he was unable to get anyone of phone to pick him up. PT said he would take a cab home. Called [employer] company for pt. [employer] rep said it would be a while before they could pick up pt. Informed pt. Pt resting while waiting on cab

## 2015-11-24 NOTE — ED Notes (Signed)
MD York CeriseForbach notified of pt's temp and heart rate. MD ordered 1000 mg tylenol

## 2015-11-24 NOTE — ED Notes (Signed)
Gave pt blue pants and non-slip socks to wear home.

## 2015-11-24 NOTE — ED Notes (Signed)
Reviewed d/c instructions, follow-up care, prescriptions, OTC antidiarrheals, antipyretics, and need to increase fluids. Pt verbalized understanding.

## 2015-11-24 NOTE — ED Notes (Signed)
Patient is resting comfortably. 

## 2016-02-24 ENCOUNTER — Emergency Department
Admission: EM | Admit: 2016-02-24 | Discharge: 2016-02-24 | Disposition: A | Discharge status: Home / Self Care | Payer: BLUE CROSS/BLUE SHIELD | Attending: Emergency Medicine | Admitting: Emergency Medicine

## 2016-02-24 ENCOUNTER — Encounter: Payer: Self-pay | Admitting: Emergency Medicine

## 2016-02-24 ENCOUNTER — Emergency Department: Payer: BLUE CROSS/BLUE SHIELD

## 2016-02-24 DIAGNOSIS — F10120 Alcohol abuse with intoxication, uncomplicated: Secondary | ICD-10-CM | POA: Insufficient documentation

## 2016-02-24 DIAGNOSIS — R42 Dizziness and giddiness: Secondary | ICD-10-CM | POA: Diagnosis present

## 2016-02-24 DIAGNOSIS — F17210 Nicotine dependence, cigarettes, uncomplicated: Secondary | ICD-10-CM | POA: Diagnosis not present

## 2016-02-24 DIAGNOSIS — F10920 Alcohol use, unspecified with intoxication, uncomplicated: Secondary | ICD-10-CM

## 2016-02-24 LAB — URINE DRUG SCREEN, QUALITATIVE (ARMC ONLY)
AMPHETAMINES, UR SCREEN: NOT DETECTED
BENZODIAZEPINE, UR SCRN: NOT DETECTED
Barbiturates, Ur Screen: NOT DETECTED
CANNABINOID 50 NG, UR ~~LOC~~: NOT DETECTED
Cocaine Metabolite,Ur ~~LOC~~: NOT DETECTED
MDMA (ECSTASY) UR SCREEN: NOT DETECTED
Methadone Scn, Ur: NOT DETECTED
Opiate, Ur Screen: NOT DETECTED
Phencyclidine (PCP) Ur S: NOT DETECTED
TRICYCLIC, UR SCREEN: NOT DETECTED

## 2016-02-24 LAB — BASIC METABOLIC PANEL
Anion gap: 8 (ref 5–15)
BUN: 11 mg/dL (ref 6–20)
CALCIUM: 8.7 mg/dL — AB (ref 8.9–10.3)
CO2: 26 mmol/L (ref 22–32)
CREATININE: 0.92 mg/dL (ref 0.61–1.24)
Chloride: 107 mmol/L (ref 101–111)
GFR calc non Af Amer: >60 mL/min (ref >60)
Glucose, Bld: 154 mg/dL — ABNORMAL HIGH (ref 65–99)
Potassium: 3.2 mmol/L — ABNORMAL LOW (ref 3.5–5.1)
SODIUM: 141 mmol/L (ref 135–145)

## 2016-02-24 LAB — CBC
HCT: 40.6 % (ref 40.0–52.0)
Hemoglobin: 14.2 g/dL (ref 13.0–18.0)
MCH: 32.3 pg (ref 26.0–34.0)
MCHC: 34.9 g/dL (ref 32.0–36.0)
MCV: 92.6 fL (ref 80.0–100.0)
Platelets: 229 10*3/uL (ref 150–440)
RBC: 4.38 MIL/uL — AB (ref 4.40–5.90)
RDW: 13.2 % (ref 11.5–14.5)
WBC: 9.2 10*3/uL (ref 3.8–10.6)

## 2016-02-24 LAB — URINALYSIS COMPLETE WITH MICROSCOPIC (ARMC ONLY)
BILIRUBIN URINE: NEGATIVE
Bacteria, UA: NONE SEEN
GLUCOSE, UA: NEGATIVE mg/dL
Hgb urine dipstick: NEGATIVE
KETONES UR: NEGATIVE mg/dL
Leukocytes, UA: NEGATIVE
Nitrite: NEGATIVE
Protein, ur: NEGATIVE mg/dL
RBC / HPF: NONE SEEN RBC/hpf (ref 0–5)
Specific Gravity, Urine: 1.009 (ref 1.005–1.030)
Squamous Epithelial / LPF: NONE SEEN
pH: 5 (ref 5.0–8.0)

## 2016-02-24 LAB — CK: Total CK: 182 U/L (ref 49–397)

## 2016-02-24 LAB — ETHANOL: Alcohol, Ethyl (B): 213 mg/dL — ABNORMAL HIGH (ref <5)

## 2016-02-24 MED ORDER — SODIUM CHLORIDE 0.9 % IV BOLUS (SEPSIS)
1000.0000 mL | Freq: Once | INTRAVENOUS | Status: AC
Start: 2016-02-24 — End: 2016-02-24
  Administered 2016-02-24: 1000 mL via INTRAVENOUS

## 2016-02-24 MED ORDER — SODIUM CHLORIDE 0.9 % IV BOLUS (SEPSIS)
1000.0000 mL | Freq: Once | INTRAVENOUS | Status: AC
  Administered 2016-02-24: 1000 mL via INTRAVENOUS

## 2016-02-24 MED ORDER — ONDANSETRON 4 MG PO TBDP
4.0000 mg | ORAL_TABLET | Freq: Three times a day (TID) | ORAL | Status: AC | PRN
Start: 2016-02-24 — End: ?

## 2016-02-24 NOTE — ED Notes (Signed)
Pt. States he was at an outdoor event today.  Pt. States he was drinking alcohol today.  Pt. States when he returned home around 10 pm he vomited multiple times.  Pt. States he feels dizzy and is unable to stand.

## 2016-02-24 NOTE — ED Notes (Signed)
Patient transported to X-ray 

## 2016-02-24 NOTE — ED Notes (Signed)
Pt reports he has had cough, nasal/ear congestion X 1 week. Pt reports productive cough thick yellow sputum, and clear thick nasal drainage.  He reports he has missed 1 week of work and yesterday was the first day he felt better, so went on the motorcycle ride.

## 2016-02-24 NOTE — ED Notes (Signed)
Pt reports he went on a 12 hour motorcycle ride today (12 pm - 12 am). Pt also reports he drunk 1/2 of a 1/5 of liquor today. Pt c/o dizziness and general malaise. Pt c/o N/V.

## 2016-02-24 NOTE — Discharge Instructions (Signed)
Alcohol Intoxication Alcohol intoxication occurs when the amount of alcohol that a person has consumed impairs his or her ability to mentally and physically function. Alcohol directly impairs the normal chemical activity of the brain. Drinking large amounts of alcohol can lead to changes in mental function and behavior, and it can cause many physical effects that can be harmful.  Alcohol intoxication can range in severity from mild to very severe. Various factors can affect the level of intoxication that occurs, such as the person's age, gender, weight, frequency of alcohol consumption, and the presence of other medical conditions (such as diabetes, seizures, or heart conditions). Dangerous levels of alcohol intoxication may occur when people drink large amounts of alcohol in a short period (binge drinking). Alcohol can also be especially dangerous when combined with certain prescription medicines or "recreational" drugs. SIGNS AND SYMPTOMS Some common signs and symptoms of mild alcohol intoxication include:  Loss of coordination.  Changes in mood and behavior.  Impaired judgment.  Slurred speech. As alcohol intoxication progresses to more severe levels, other signs and symptoms will appear. These may include:  Vomiting.  Confusion and impaired memory.  Slowed breathing.  Seizures.  Loss of consciousness. DIAGNOSIS  Your health care provider will take a medical history and perform a physical exam. You will be asked about the amount and type of alcohol you have consumed. Blood tests will be done to measure the concentration of alcohol in your blood. In many places, your blood alcohol level must be lower than 80 mg/dL (1.61%0.08%) to legally drive. However, many dangerous effects of alcohol can occur at much lower levels.  TREATMENT  People with alcohol intoxication often do not require treatment. Most of the effects of alcohol intoxication are temporary, and they go away as the alcohol naturally  leaves the body. Your health care provider will monitor your condition until you are stable enough to go home. Fluids are sometimes given through an IV access tube to help prevent dehydration.  HOME CARE INSTRUCTIONS  Do not drive after drinking alcohol.  Stay hydrated. Drink enough water and fluids to keep your urine clear or pale yellow. Avoid caffeine.   Only take over-the-counter or prescription medicines as directed by your health care provider.  SEEK MEDICAL CARE IF:   You have persistent vomiting.   You do not feel better after a few days.  You have frequent alcohol intoxication. Your health care provider can help determine if you should see a substance use treatment counselor. SEEK IMMEDIATE MEDICAL CARE IF:   You become shaky or tremble when you try to stop drinking.   You shake uncontrollably (seizure).   You throw up (vomit) blood. This may be bright red or may look like black coffee grounds.   You have blood in your stool. This may be bright red or may appear as a black, tarry, bad smelling stool.   You become lightheaded or faint.  MAKE SURE YOU:   Understand these instructions.  Will watch your condition.  Will get help right away if you are not doing well or get worse.   This information is not intended to replace advice given to you by your health care provider. Make sure you discuss any questions you have with your health care provider.   Document Released: 06/18/2005 Document Revised: 05/11/2013 Document Reviewed: 02/11/2013 Elsevier Interactive Patient Education 2016 [employer] Inc[employer]  Dizziness Dizziness is a common problem. It is a feeling of unsteadiness or light-headedness. You may feel like you are about  to faint. Dizziness can lead to injury if you stumble or fall. Anyone can become dizzy, but dizziness is more common in older adults. This condition can be caused by a number of things, including medicines, dehydration, or illness. HOME CARE  INSTRUCTIONS Taking these steps may help with your condition: Eating and Drinking  Drink enough fluid to keep your urine clear or pale yellow. This helps to keep you from becoming dehydrated. Try to drink more clear fluids, such as water.  Do not drink alcohol.  Limit your caffeine intake if directed by your health care provider.  Limit your salt intake if directed by your health care provider. Activity  Avoid making quick movements.  Rise slowly from chairs and steady yourself until you feel okay.  In the morning, first sit up on the side of the bed. When you feel okay, stand slowly while you hold onto something until you know that your balance is fine.  Move your legs often if you need to stand in one place for a long time. Tighten and relax your muscles in your legs while you are standing.  Do not drive or operate heavy machinery if you feel dizzy.  Avoid bending down if you feel dizzy. Place items in your home so that they are easy for you to reach without leaning over. Lifestyle  Do not use any tobacco products, including cigarettes, chewing tobacco, or electronic cigarettes. If you need help quitting, ask your health care provider.  Try to reduce your stress level, such as with yoga or meditation. Talk with your health care provider if you need help. General Instructions  Watch your dizziness for any changes.  Take medicines only as directed by your health care provider. Talk with your health care provider if you think that your dizziness is caused by a medicine that you are taking.  Tell a friend or a family member that you are feeling dizzy. If he or she notices any changes in your behavior, have this person call your health care provider.  Keep all follow-up visits as directed by your health care provider. This is important. SEEK MEDICAL CARE IF:  Your dizziness does not go away.  Your dizziness or light-headedness gets worse.  You feel nauseous.  You have  reduced hearing.  You have new symptoms.  You are unsteady on your feet or you feel like the room is spinning. SEEK IMMEDIATE MEDICAL CARE IF:  You vomit or have diarrhea and are unable to eat or drink anything.  You have problems talking, walking, swallowing, or using your arms, hands, or legs.  You feel generally weak.  You are not thinking clearly or you have trouble forming sentences. It may take a friend or family member to notice this.  You have chest pain, abdominal pain, shortness of breath, or sweating.  Your vision changes.  You notice any bleeding.  You have a headache.  You have neck pain or a stiff neck.  You have a fever.   This information is not intended to replace advice given to you by your health care provider. Make sure you discuss any questions you have with your health care provider.   Document Released: 03/04/2001 Document Revised: 01/23/2015 Document Reviewed: 09/04/2014 Elsevier Interactive Patient Education [employer]
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
## 2018-08-05 DIAGNOSIS — Y9389 Activity, other specified: Secondary | ICD-10-CM | POA: Diagnosis not present

## 2018-08-05 DIAGNOSIS — Y92410 Unspecified street and highway as the place of occurrence of the external cause: Secondary | ICD-10-CM | POA: Diagnosis not present

## 2018-08-05 DIAGNOSIS — Z87891 Personal history of nicotine dependence: Secondary | ICD-10-CM | POA: Diagnosis not present

## 2018-08-05 DIAGNOSIS — Z79899 Other long term (current) drug therapy: Secondary | ICD-10-CM | POA: Diagnosis not present

## 2018-08-05 DIAGNOSIS — Y999 Unspecified external cause status: Secondary | ICD-10-CM | POA: Diagnosis not present

## 2018-08-05 DIAGNOSIS — M5412 Radiculopathy, cervical region: Secondary | ICD-10-CM | POA: Diagnosis not present

## 2018-08-05 DIAGNOSIS — K5900 Constipation, unspecified: Secondary | ICD-10-CM | POA: Diagnosis not present

## 2018-08-05 DIAGNOSIS — S199XXA Unspecified injury of neck, initial encounter: Secondary | ICD-10-CM | POA: Diagnosis present
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
## 2018-08-06 ENCOUNTER — Emergency Department
Admission: EM | Admit: 2018-08-06 | Discharge: 2018-08-06 | Disposition: A | Discharge status: Home / Self Care | Payer: No Typology Code available for payment source | Attending: Emergency Medicine | Admitting: Emergency Medicine

## 2018-08-06 ENCOUNTER — Emergency Department: Payer: No Typology Code available for payment source

## 2018-08-06 DIAGNOSIS — K5900 Constipation, unspecified: Secondary | ICD-10-CM

## 2018-08-06 DIAGNOSIS — M5412 Radiculopathy, cervical region: Secondary | ICD-10-CM

## 2018-08-06 MED ORDER — LACTULOSE 10 G PO PACK: 10.0000 g | PACK | Freq: Three times a day (TID) | ORAL | 0 refills | Status: AC | PRN
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
